# Patient Record
Sex: Female | Born: 1945
Health system: Southern US, Community
[De-identification: ages and names within clinical notes are randomized; demographics above are authoritative.]

## PROBLEM LIST (undated history)

## (undated) DIAGNOSIS — I10 Essential (primary) hypertension: Secondary | ICD-10-CM

## (undated) DIAGNOSIS — R112 Nausea with vomiting, unspecified: Secondary | ICD-10-CM

## (undated) DIAGNOSIS — Z9889 Other specified postprocedural states: Secondary | ICD-10-CM

## (undated) DIAGNOSIS — I499 Cardiac arrhythmia, unspecified: Secondary | ICD-10-CM

## (undated) DIAGNOSIS — C801 Malignant (primary) neoplasm, unspecified: Secondary | ICD-10-CM

## (undated) DIAGNOSIS — F419 Anxiety disorder, unspecified: Secondary | ICD-10-CM

## (undated) DIAGNOSIS — E039 Hypothyroidism, unspecified: Secondary | ICD-10-CM

## (undated) HISTORY — PX: APPENDECTOMY: SHX54

## (undated) HISTORY — PX: BREAST SURGERY: SHX581

## (undated) HISTORY — PX: TUBAL LIGATION: SHX77

## (undated) HISTORY — PX: ABDOMINAL HYSTERECTOMY: SHX81

## (undated) HISTORY — PX: TONSILLECTOMY: SUR1361

---

## 1985-08-15 HISTORY — PX: HIP SURGERY: SHX245

## 1999-07-22 ENCOUNTER — Encounter: Payer: Self-pay | Admitting: Emergency Medicine

## 1999-07-22 ENCOUNTER — Emergency Department (HOSPITAL_COMMUNITY): Admission: EM | Admit: 1999-07-22 | Discharge: 1999-07-22 | Payer: Self-pay | Admitting: Emergency Medicine

## 2003-07-08 ENCOUNTER — Emergency Department (HOSPITAL_COMMUNITY): Admission: AD | Admit: 2003-07-08 | Discharge: 2003-07-08 | Payer: Self-pay | Admitting: Family Medicine

## 2008-03-06 ENCOUNTER — Encounter: Payer: Self-pay | Admitting: Orthopedic Surgery

## 2008-04-07 ENCOUNTER — Ambulatory Visit: Payer: Self-pay | Admitting: Orthopedic Surgery

## 2008-04-07 DIAGNOSIS — M7580 Other shoulder lesions, unspecified shoulder: Secondary | ICD-10-CM

## 2008-04-07 DIAGNOSIS — M25519 Pain in unspecified shoulder: Secondary | ICD-10-CM | POA: Insufficient documentation

## 2008-04-07 DIAGNOSIS — M25819 Other specified joint disorders, unspecified shoulder: Secondary | ICD-10-CM | POA: Insufficient documentation

## 2008-04-09 ENCOUNTER — Telehealth: Payer: Self-pay | Admitting: Orthopedic Surgery

## 2008-05-15 ENCOUNTER — Telehealth: Payer: Self-pay | Admitting: Orthopedic Surgery

## 2008-05-22 ENCOUNTER — Telehealth: Payer: Self-pay | Admitting: Orthopedic Surgery

## 2008-06-03 ENCOUNTER — Ambulatory Visit (HOSPITAL_COMMUNITY): Admission: RE | Admit: 2008-06-03 | Discharge: 2008-06-03 | Payer: Self-pay | Admitting: Orthopedic Surgery

## 2008-06-09 ENCOUNTER — Ambulatory Visit: Payer: Self-pay | Admitting: Orthopedic Surgery

## 2008-06-10 ENCOUNTER — Telehealth: Payer: Self-pay | Admitting: Orthopedic Surgery

## 2008-06-12 ENCOUNTER — Encounter: Payer: Self-pay | Admitting: Orthopedic Surgery

## 2008-07-14 ENCOUNTER — Encounter: Payer: Self-pay | Admitting: Orthopedic Surgery

## 2008-07-16 ENCOUNTER — Ambulatory Visit: Payer: Self-pay | Admitting: Orthopedic Surgery

## 2008-09-01 ENCOUNTER — Ambulatory Visit: Payer: Self-pay | Admitting: Orthopedic Surgery

## 2008-09-01 DIAGNOSIS — M24519 Contracture, unspecified shoulder: Secondary | ICD-10-CM | POA: Insufficient documentation

## 2015-01-05 ENCOUNTER — Other Ambulatory Visit: Payer: Self-pay

## 2015-01-14 ENCOUNTER — Other Ambulatory Visit: Payer: Self-pay | Admitting: Internal Medicine

## 2015-01-14 DIAGNOSIS — R921 Mammographic calcification found on diagnostic imaging of breast: Secondary | ICD-10-CM

## 2015-01-16 ENCOUNTER — Other Ambulatory Visit: Payer: Self-pay | Admitting: Internal Medicine

## 2015-01-16 ENCOUNTER — Ambulatory Visit
Admission: RE | Admit: 2015-01-16 | Discharge: 2015-01-16 | Disposition: A | Discharge status: Home / Self Care | Payer: Medicare HMO | Source: Ambulatory Visit | Attending: Internal Medicine | Admitting: Internal Medicine

## 2015-01-16 DIAGNOSIS — R921 Mammographic calcification found on diagnostic imaging of breast: Secondary | ICD-10-CM

## 2015-03-13 NOTE — Patient Instructions (Signed)
Your procedure is scheduled on: 03/23/2015  Report to [facility] at   1150  AM.  Call this number if you have problems the morning of surgery: [phone]   Do not eat food or drink liquids :After Midnight.      Take these medicines the morning of surgery with A SIP OF WATER: none   Do not wear jewelry, make-up or nail polish.  Do not wear lotions, powders, or perfumes.  Do not shave 48 hours prior to surgery.  Do not bring valuables to the hospital.  Contacts, dentures or bridgework may not be worn into surgery.  Leave suitcase in the car. After surgery it may be brought to your room.  For patients admitted to the hospital, checkout time is 11:00 AM the day of discharge.   Patients discharged the day of surgery will not be allowed to drive home.  :     Please read over the following fact sheets that you were given: Coughing and Deep Breathing, Surgical Site Infection Prevention, Anesthesia Post-op Instructions and Care and Recovery After Surgery    Cataract A cataract is a clouding of the lens of the eye. When a lens becomes cloudy, vision is reduced based on the degree and nature of the clouding. Many cataracts reduce vision to some degree. Some cataracts make people more near-sighted as they develop. Other cataracts increase glare. Cataracts that are ignored and become worse can sometimes look white. The white color can be seen through the pupil. CAUSES   Aging. However, cataracts may occur at any age, even in newborns.   Certain drugs.   Trauma to the eye.   Certain diseases such as diabetes.   Specific eye diseases such as chronic inflammation inside the eye or a sudden attack of a rare form of glaucoma.   Inherited or acquired medical problems.  SYMPTOMS   Gradual, progressive drop in vision in the affected eye.   Severe, rapid visual loss. This most often happens when trauma is the cause.  DIAGNOSIS  To detect a cataract, an eye doctor examines the lens. Cataracts are  best diagnosed with an exam of the eyes with the pupils enlarged (dilated) by drops.  TREATMENT  For an early cataract, vision may improve by using different eyeglasses or stronger lighting. If that does not help your vision, surgery is the only effective treatment. A cataract needs to be surgically removed when vision loss interferes with your everyday activities, such as driving, reading, or watching TV. A cataract may also have to be removed if it prevents examination or treatment of another eye problem. Surgery removes the cloudy lens and usually replaces it with a substitute lens (intraocular lens, IOL).  At a time when both you and your doctor agree, the cataract will be surgically removed. If you have cataracts in both eyes, only one is usually removed at a time. This allows the operated eye to heal and be out of danger from any possible problems after surgery (such as infection or poor wound healing). In rare cases, a cataract may be doing damage to your eye. In these cases, your caregiver may advise surgical removal right away. The vast majority of people who have cataract surgery have better vision afterward. HOME CARE INSTRUCTIONS  If you are not planning surgery, you may be asked to do the following:  Use different eyeglasses.   Use stronger or brighter lighting.   Ask your eye doctor about reducing your medicine dose or changing medicines if  it is thought that a medicine caused your cataract. Changing medicines does not make the cataract go away on its own.   Become familiar with your surroundings. Poor vision can lead to injury. Avoid bumping into things on the affected side. You are at a higher risk for tripping or falling.   Exercise extreme care when driving or operating machinery.   Wear sunglasses if you are sensitive to bright light or experiencing problems with glare.  SEEK IMMEDIATE MEDICAL CARE IF:   You have a worsening or sudden vision loss.   You notice redness,  swelling, or increasing pain in the eye.   You have a fever.  Document Released: 08/01/2005 Document Revised: 07/21/2011 Document Reviewed: 03/25/2011 [facility] Patient Information 2012 [place].PATIENT INSTRUCTIONS POST-ANESTHESIA  IMMEDIATELY FOLLOWING SURGERY:  Do not drive or operate machinery for the first twenty four hours after surgery.  Do not make any important decisions for twenty four hours after surgery or while taking narcotic pain medications or sedatives.  If you develop intractable nausea and vomiting or a severe headache please notify your doctor immediately.  FOLLOW-UP:  Please make an appointment with your surgeon as instructed. You do not need to follow up with anesthesia unless specifically instructed to do so.  WOUND CARE INSTRUCTIONS (if applicable):  Keep a dry clean dressing on the anesthesia/puncture wound site if there is drainage.  Once the wound has quit draining you may leave it open to air.  Generally you should leave the bandage intact for twenty four hours unless there is drainage.  If the epidural site drains for more than 36-48 hours please call the anesthesia department.  QUESTIONS?:  Please feel free to call your physician or the hospital operator if you have any questions, and they will be happy to assist you.

## 2015-03-18 ENCOUNTER — Encounter (HOSPITAL_COMMUNITY)
Admission: RE | Admit: 2015-03-18 | Discharge: 2015-03-18 | Disposition: A | Discharge status: Home / Self Care | Payer: Medicare HMO | Source: Ambulatory Visit | Attending: Ophthalmology | Admitting: Ophthalmology

## 2015-03-18 ENCOUNTER — Other Ambulatory Visit: Payer: Self-pay

## 2015-03-18 ENCOUNTER — Encounter (HOSPITAL_COMMUNITY): Payer: Self-pay

## 2015-03-18 DIAGNOSIS — H2512 Age-related nuclear cataract, left eye: Secondary | ICD-10-CM | POA: Insufficient documentation

## 2015-03-18 DIAGNOSIS — Z01818 Encounter for other preprocedural examination: Secondary | ICD-10-CM | POA: Diagnosis present

## 2015-03-18 HISTORY — DX: Other specified postprocedural states: Z98.890

## 2015-03-18 HISTORY — DX: Essential (primary) hypertension: I10

## 2015-03-18 HISTORY — DX: Cardiac arrhythmia, unspecified: I49.9

## 2015-03-18 HISTORY — DX: Hypothyroidism, unspecified: E03.9

## 2015-03-18 HISTORY — DX: Anxiety disorder, unspecified: F41.9

## 2015-03-18 HISTORY — DX: Other specified postprocedural states: R11.2

## 2015-03-18 LAB — BASIC METABOLIC PANEL
Anion gap: 5 (ref 5–15)
BUN: 14 mg/dL (ref 6–20)
CO2: 30 mmol/L (ref 22–32)
CREATININE: 0.63 mg/dL (ref 0.44–1.00)
Calcium: 8.8 mg/dL — ABNORMAL LOW (ref 8.9–10.3)
Chloride: 105 mmol/L (ref 101–111)
GFR calc Af Amer: >60 mL/min (ref >60)
GFR calc non Af Amer: >60 mL/min (ref >60)
Glucose, Bld: 99 mg/dL (ref 65–99)
Potassium: 3.8 mmol/L (ref 3.5–5.1)
Sodium: 140 mmol/L (ref 135–145)

## 2015-03-18 LAB — CBC WITH DIFFERENTIAL/PLATELET
BASOS ABS: 0 10*3/uL (ref 0.0–0.1)
Basophils Relative: 0 % (ref 0–1)
EOS PCT: 2 % (ref 0–5)
Eosinophils Absolute: 0.1 10*3/uL (ref 0.0–0.7)
HCT: 35.8 % — ABNORMAL LOW (ref 36.0–46.0)
Hemoglobin: 11.3 g/dL — ABNORMAL LOW (ref 12.0–15.0)
LYMPHS ABS: 2.2 10*3/uL (ref 0.7–4.0)
Lymphocytes Relative: 40 % (ref 12–46)
MCH: 26.7 pg (ref 26.0–34.0)
MCHC: 31.6 g/dL (ref 30.0–36.0)
MCV: 84.4 fL (ref 78.0–100.0)
Monocytes Absolute: 0.5 10*3/uL (ref 0.1–1.0)
Monocytes Relative: 8 % (ref 3–12)
NEUTROS PCT: 50 % (ref 43–77)
Neutro Abs: 2.7 10*3/uL (ref 1.7–7.7)
PLATELETS: 257 10*3/uL (ref 150–400)
RBC: 4.24 MIL/uL (ref 3.87–5.11)
RDW: 13.9 % (ref 11.5–15.5)
WBC: 5.5 10*3/uL (ref 4.0–10.5)

## 2015-03-20 MED ORDER — TETRACAINE HCL 0.5 % OP SOLN
OPHTHALMIC | Status: AC
  Filled 2015-03-20: qty 2

## 2015-03-20 MED ORDER — CYCLOPENTOLATE-PHENYLEPHRINE OP SOLN OPTIME - NO CHARGE
OPHTHALMIC | Status: AC
  Filled 2015-03-20: qty 2

## 2015-03-20 MED ORDER — NEOMYCIN-POLYMYXIN-DEXAMETH 3.5-10000-0.1 OP SUSP
OPHTHALMIC | Status: AC
Start: 2015-03-20 — End: 2015-03-20
  Filled 2015-03-20: qty 5

## 2015-03-20 MED ORDER — PHENYLEPHRINE HCL 2.5 % OP SOLN
OPHTHALMIC | Status: AC
  Filled 2015-03-20: qty 15

## 2015-03-20 MED ORDER — LIDOCAINE HCL (PF) 1 % IJ SOLN
INTRAMUSCULAR | Status: AC
  Filled 2015-03-20: qty 2

## 2015-03-20 MED ORDER — LIDOCAINE HCL 3.5 % OP GEL
OPHTHALMIC | Status: AC
  Filled 2015-03-20: qty 1

## 2015-03-23 ENCOUNTER — Ambulatory Visit (HOSPITAL_COMMUNITY): Payer: Medicare HMO | Admitting: Anesthesiology

## 2015-03-23 ENCOUNTER — Encounter (HOSPITAL_COMMUNITY): Payer: Self-pay | Admitting: Ophthalmology

## 2015-03-23 ENCOUNTER — Ambulatory Visit (HOSPITAL_COMMUNITY)
Admission: RE | Admit: 2015-03-23 | Discharge: 2015-03-23 | Disposition: A | Discharge status: Home / Self Care | Payer: Medicare HMO | Source: Ambulatory Visit | Attending: Ophthalmology | Admitting: Ophthalmology

## 2015-03-23 ENCOUNTER — Encounter (HOSPITAL_COMMUNITY)
Admission: RE | Disposition: A | Discharge status: Home / Self Care | Payer: Self-pay | Source: Ambulatory Visit | Attending: Ophthalmology

## 2015-03-23 DIAGNOSIS — F419 Anxiety disorder, unspecified: Secondary | ICD-10-CM | POA: Diagnosis not present

## 2015-03-23 DIAGNOSIS — Z79899 Other long term (current) drug therapy: Secondary | ICD-10-CM | POA: Insufficient documentation

## 2015-03-23 DIAGNOSIS — I10 Essential (primary) hypertension: Secondary | ICD-10-CM | POA: Insufficient documentation

## 2015-03-23 DIAGNOSIS — H2512 Age-related nuclear cataract, left eye: Secondary | ICD-10-CM | POA: Insufficient documentation

## 2015-03-23 DIAGNOSIS — Z7951 Long term (current) use of inhaled steroids: Secondary | ICD-10-CM | POA: Insufficient documentation

## 2015-03-23 DIAGNOSIS — E039 Hypothyroidism, unspecified: Secondary | ICD-10-CM | POA: Diagnosis not present

## 2015-03-23 HISTORY — PX: CATARACT EXTRACTION W/PHACO: SHX586

## 2015-03-23 SURGERY — PHACOEMULSIFICATION, CATARACT, WITH IOL INSERTION
Anesthesia: Monitor Anesthesia Care | Site: Eye | Laterality: Left

## 2015-03-23 MED ORDER — CYCLOPENTOLATE-PHENYLEPHRINE 0.2-1 % OP SOLN
1.0000 [drp] | OPHTHALMIC | Status: AC
  Administered 2015-03-23 (×3): 1 [drp] via OPHTHALMIC

## 2015-03-23 MED ORDER — NEOMYCIN-POLYMYXIN-DEXAMETH 3.5-10000-0.1 OP SUSP
OPHTHALMIC | Status: AC
  Filled 2015-03-23: qty 5

## 2015-03-23 MED ORDER — FENTANYL CITRATE (PF) 100 MCG/2ML IJ SOLN
INTRAMUSCULAR | Status: AC
  Filled 2015-03-23: qty 2

## 2015-03-23 MED ORDER — TETRACAINE HCL 0.5 % OP SOLN
1.0000 [drp] | OPHTHALMIC | Status: AC
  Administered 2015-03-23 (×3): 1 [drp] via OPHTHALMIC

## 2015-03-23 MED ORDER — FENTANYL CITRATE (PF) 100 MCG/2ML IJ SOLN
25.0000 ug | INTRAMUSCULAR | Status: AC
  Administered 2015-03-23 (×2): 25 ug via INTRAVENOUS

## 2015-03-23 MED ORDER — EPINEPHRINE HCL 1 MG/ML IJ SOLN
INTRAOCULAR | Status: DC | PRN
  Administered 2015-03-23: 500 mL

## 2015-03-23 MED ORDER — PHENYLEPHRINE HCL 2.5 % OP SOLN
1.0000 [drp] | OPHTHALMIC | Status: AC
  Administered 2015-03-23 (×3): 1 [drp] via OPHTHALMIC

## 2015-03-23 MED ORDER — ONDANSETRON HCL 4 MG/2ML IJ SOLN
INTRAMUSCULAR | Status: AC
  Filled 2015-03-23: qty 2

## 2015-03-23 MED ORDER — NEOMYCIN-POLYMYXIN-DEXAMETH 3.5-10000-0.1 OP SUSP
OPHTHALMIC | Status: DC | PRN
  Administered 2015-03-23: 2 [drp] via OPHTHALMIC

## 2015-03-23 MED ORDER — LIDOCAINE HCL (PF) 1 % IJ SOLN
INTRAMUSCULAR | Status: DC | PRN
  Administered 2015-03-23: .5 mL

## 2015-03-23 MED ORDER — BSS IO SOLN
INTRAOCULAR | Status: DC | PRN
  Administered 2015-03-23: 15 mL

## 2015-03-23 MED ORDER — POVIDONE-IODINE 5 % OP SOLN
OPHTHALMIC | Status: DC | PRN
  Administered 2015-03-23: 1 via OPHTHALMIC

## 2015-03-23 MED ORDER — LIDOCAINE HCL 3.5 % OP GEL: 1.0000 "application " | Freq: Once | OPHTHALMIC | Status: DC

## 2015-03-23 MED ORDER — PROVISC 10 MG/ML IO SOLN
INTRAOCULAR | Status: DC | PRN
  Administered 2015-03-23: 0.85 mL via INTRAOCULAR

## 2015-03-23 MED ORDER — EPINEPHRINE HCL 1 MG/ML IJ SOLN
INTRAMUSCULAR | Status: AC
  Filled 2015-03-23: qty 1

## 2015-03-23 MED ORDER — LACTATED RINGERS IV SOLN
INTRAVENOUS | Status: DC
  Administered 2015-03-23: 13:00:00 via INTRAVENOUS

## 2015-03-23 MED ORDER — MIDAZOLAM HCL 2 MG/2ML IJ SOLN
INTRAMUSCULAR | Status: AC
  Filled 2015-03-23: qty 2

## 2015-03-23 MED ORDER — ONDANSETRON HCL 4 MG/2ML IJ SOLN
4.0000 mg | Freq: Once | INTRAMUSCULAR | Status: AC
  Administered 2015-03-23: 4 mg via INTRAVENOUS

## 2015-03-23 MED ORDER — MIDAZOLAM HCL 2 MG/2ML IJ SOLN
1.0000 mg | INTRAMUSCULAR | Status: DC | PRN
  Administered 2015-03-23: 2 mg via INTRAVENOUS

## 2015-03-23 SURGICAL SUPPLY — 33 items
CAPSULAR TENSION RING-AMO (OPHTHALMIC RELATED) IMPLANT
CLOTH BEACON ORANGE TIMEOUT ST (SAFETY) ×2 IMPLANT
EYE SHIELD UNIVERSAL CLEAR (GAUZE/BANDAGES/DRESSINGS) ×2 IMPLANT
GLOVE BIO SURGEON STRL SZ 6.5 (GLOVE) IMPLANT
GLOVE BIOGEL PI IND STRL 6.5 (GLOVE) IMPLANT
GLOVE BIOGEL PI IND STRL 7.0 (GLOVE) ×1 IMPLANT
GLOVE BIOGEL PI IND STRL 7.5 (GLOVE) IMPLANT
GLOVE BIOGEL PI INDICATOR 6.5 (GLOVE)
GLOVE BIOGEL PI INDICATOR 7.0 (GLOVE) ×1
GLOVE BIOGEL PI INDICATOR 7.5 (GLOVE)
GLOVE ECLIPSE 6.5 STRL STRAW (GLOVE) IMPLANT
GLOVE ECLIPSE 7.0 STRL STRAW (GLOVE) IMPLANT
GLOVE ECLIPSE 7.5 STRL STRAW (GLOVE) IMPLANT
GLOVE EXAM NITRILE LRG STRL (GLOVE) IMPLANT
GLOVE EXAM NITRILE MD LF STRL (GLOVE) ×2 IMPLANT
GLOVE SKINSENSE NS SZ6.5 (GLOVE)
GLOVE SKINSENSE NS SZ7.0 (GLOVE)
GLOVE SKINSENSE STRL SZ6.5 (GLOVE) IMPLANT
GLOVE SKINSENSE STRL SZ7.0 (GLOVE) IMPLANT
KIT VITRECTOMY (OPHTHALMIC RELATED) IMPLANT
PAD ARMBOARD 7.5X6 YLW CONV (MISCELLANEOUS) ×2 IMPLANT
PROC W NO LENS (INTRAOCULAR LENS)
PROC W SPEC LENS (INTRAOCULAR LENS)
PROCESS W NO LENS (INTRAOCULAR LENS) IMPLANT
PROCESS W SPEC LENS (INTRAOCULAR LENS) IMPLANT
RETRACTOR IRIS SIGHTPATH (OPHTHALMIC RELATED) IMPLANT
RING MALYGIN (MISCELLANEOUS) IMPLANT
SIGHTPATH CAT PROC W REG LENS (Ophthalmic Related) ×2 IMPLANT
SYRINGE LUER LOK 1CC (MISCELLANEOUS) ×2 IMPLANT
TAPE SURG TRANSPORE 1 IN (GAUZE/BANDAGES/DRESSINGS) ×1 IMPLANT
TAPE SURGICAL TRANSPORE 1 IN (GAUZE/BANDAGES/DRESSINGS) ×1
VISCOELASTIC ADDITIONAL (OPHTHALMIC RELATED) IMPLANT
WATER STERILE IRR 250ML POUR (IV SOLUTION) ×2 IMPLANT

## 2015-03-23 NOTE — H&P (Signed)
I have reviewed the H&P, the patient was re-examined, and I have identified no interval changes in medical condition and plan of care since the history and physical of record  

## 2015-03-23 NOTE — Anesthesia Postprocedure Evaluation (Signed)
  Anesthesia Post-op Note  Patient: Cynthia Gonzales  Procedure(s) Performed: Procedure(s) with comments: CATARACT EXTRACTION PHACO AND INTRAOCULAR LENS PLACEMENT (IOC) (Left) - CDE 10.11  Patient Location: Short Stay  Anesthesia Type:MAC  Level of Consciousness: awake, alert , oriented and patient cooperative  Airway and Oxygen Therapy: Patient Spontanous Breathing  Post-op Pain: none  Post-op Assessment: Post-op Vital signs reviewed, Patient's Cardiovascular Status Stable, Respiratory Function Stable, RESPIRATORY FUNCTION UNSTABLE, No signs of Nausea or vomiting and Pain level controlled              Post-op Vital Signs: Reviewed and stable  Last Vitals:  Filed Vitals:   03/23/15 1340  BP: 123/66  Temp:   Resp: 39    Complications: No apparent anesthesia complications

## 2015-03-23 NOTE — Anesthesia Procedure Notes (Signed)
Procedure Name: MAC Date/Time: 03/23/2015 1:45 PM Performed by: Vista Deck Pre-anesthesia Checklist: Patient identified, Emergency Drugs available, Suction available, Timeout performed and Patient being monitored Patient Re-evaluated:Patient Re-evaluated prior to inductionOxygen Delivery Method: Nasal Cannula

## 2015-03-23 NOTE — Anesthesia Preprocedure Evaluation (Signed)
Anesthesia Evaluation  Patient identified by MRN, date of birth, ID band Patient awake    Reviewed: Allergy & Precautions, NPO status , Patient's Chart, lab work & pertinent test results, reviewed documented beta blocker date and time   History of Anesthesia Complications (+) PONV and history of anesthetic complications  Airway Mallampati: II  TM Distance: >3 FB     Dental  (+) Teeth Intact   Pulmonary neg pulmonary ROS,  breath sounds clear to auscultation        Cardiovascular hypertension, Pt. on medications and Pt. on home beta blockers + dysrhythmias Rhythm:Regular     Neuro/Psych PSYCHIATRIC DISORDERS Anxiety    GI/Hepatic negative GI ROS,   Endo/Other  Hypothyroidism   Renal/GU      Musculoskeletal   Abdominal   Peds  Hematology   Anesthesia Other Findings   Reproductive/Obstetrics                             Anesthesia Physical Anesthesia Plan  ASA: II  Anesthesia Plan: MAC   Post-op Pain Management:    Induction: Intravenous  Airway Management Planned: Nasal Cannula  Additional Equipment:   Intra-op Plan:   Post-operative Plan:   Informed Consent: I have reviewed the patients History and Physical, chart, labs and discussed the procedure including the risks, benefits and alternatives for the proposed anesthesia with the patient or authorized representative who has indicated his/her understanding and acceptance.     Plan Discussed with:   Anesthesia Plan Comments:         Anesthesia Quick Evaluation

## 2015-03-23 NOTE — Discharge Instructions (Signed)

## 2015-03-23 NOTE — Transfer of Care (Signed)
Immediate Anesthesia Transfer of Care Note  Patient: Cynthia Gonzales  Procedure(s) Performed: Procedure(s) with comments: CATARACT EXTRACTION PHACO AND INTRAOCULAR LENS PLACEMENT (IOC) (Left) - CDE 10.11  Patient Location: Short Stay  Anesthesia Type:MAC  Level of Consciousness: awake, alert , oriented and patient cooperative  Airway & Oxygen Therapy: Patient Spontanous Breathing  Post-op Assessment: Report given to RN, Post -op Vital signs reviewed and stable and Patient moving all extremities  Post vital signs: Reviewed and stable  Last Vitals:  Filed Vitals:   03/23/15 1340  BP: 123/66  Temp:   Resp: 39    Complications: No apparent anesthesia complications

## 2015-03-23 NOTE — Op Note (Signed)
Date of Admission: 03/23/2015  Date of Surgery: 03/23/2015   Pre-Op Dx: Cataract Left Eye  Post-Op Dx: Senile Nuclear Cataract Left  Eye,  Dx Code H25.12  Surgeon: Tonny Branch, M.D.  Assistants: None  Anesthesia: Topical with MAC  Indications: Painless, progressive loss of vision with compromise of daily activities.  Surgery: Cataract Extraction with Intraocular lens Implant Left Eye  Discription: The patient had dilating drops and viscous lidocaine placed into the Left eye in the pre-op holding area. After transfer to the operating room, a time out was performed. The patient was then prepped and draped. Beginning with a 68 degree blade a paracentesis port was made at the surgeon's 2 o'clock position. The anterior chamber was then filled with 1% non-preserved lidocaine. This was followed by filling the anterior chamber with Provisc.  A 2.36mm keratome blade was used to make a clear corneal incision at the temporal limbus.  A bent cystatome needle was used to create a continuous tear capsulotomy. Hydrodissection was performed with balanced salt solution on a Fine canula. The lens nucleus was then removed using the phacoemulsification handpiece. Residual cortex was removed with the I&A handpiece. The anterior chamber and capsular bag were refilled with Provisc. A posterior chamber intraocular lens was placed into the capsular bag with it's injector. The implant was positioned with the Kuglan hook. The Provisc was then removed from the anterior chamber and capsular bag with the I&A handpiece. Stromal hydration of the main incision and paracentesis port was performed with BSS on a Fine canula. The wounds were tested for leak which was negative. The patient tolerated the procedure well. There were no operative complications. The patient was then transferred to the recovery room in stable condition.  Complications: None  Specimen: None  EBL: None  Prosthetic device: Hoya iSert 250, power 23.5 D, SN  [id number].

## 2015-03-24 ENCOUNTER — Encounter (HOSPITAL_COMMUNITY): Payer: Self-pay | Admitting: Ophthalmology

## 2015-04-08 ENCOUNTER — Other Ambulatory Visit: Payer: Self-pay

## 2015-05-18 ENCOUNTER — Other Ambulatory Visit (HOSPITAL_COMMUNITY): Payer: Medicare HMO

## 2017-11-23 ENCOUNTER — Other Ambulatory Visit: Payer: Self-pay | Admitting: Internal Medicine

## 2017-11-23 DIAGNOSIS — Z1231 Encounter for screening mammogram for malignant neoplasm of breast: Secondary | ICD-10-CM

## 2018-02-20 ENCOUNTER — Ambulatory Visit
Admission: RE | Admit: 2018-02-20 | Discharge: 2018-02-20 | Disposition: A | Discharge status: Home / Self Care | Payer: Medicare HMO | Source: Ambulatory Visit | Attending: Internal Medicine | Admitting: Internal Medicine

## 2018-02-20 DIAGNOSIS — Z1231 Encounter for screening mammogram for malignant neoplasm of breast: Secondary | ICD-10-CM

## 2018-06-15 NOTE — Patient Instructions (Signed)
Your procedure is scheduled on: 06/25/2018   Report to [facility] at  900  AM.  Call this number if you have problems the morning of surgery: [phone]   Do not eat food or drink liquids :After Midnight.      Take these medicines the morning of surgery with A SIP OF WATER: amlodipine, levothyroxine, metoprolol.   Do not wear jewelry, make-up or nail polish.  Do not wear lotions, powders, or perfumes. You may wear deodorant.  Do not shave 48 hours prior to surgery.  Do not bring valuables to the hospital.  Contacts, dentures or bridgework may not be worn into surgery.  Leave suitcase in the car. After surgery it may be brought to your room.  For patients admitted to the hospital, checkout time is 11:00 AM the day of discharge.   Patients discharged the day of surgery will not be allowed to drive home.  :     Please read over the following fact sheets that you were given: Coughing and Deep Breathing, Surgical Site Infection Prevention, Anesthesia Post-op Instructions and Care and Recovery After Surgery    Cataract A cataract is a clouding of the lens of the eye. When a lens becomes cloudy, vision is reduced based on the degree and nature of the clouding. Many cataracts reduce vision to some degree. Some cataracts make people more near-sighted as they develop. Other cataracts increase glare. Cataracts that are ignored and become worse can sometimes look white. The white color can be seen through the pupil. CAUSES   Aging. However, cataracts may occur at any age, even in newborns.   Certain drugs.   Trauma to the eye.   Certain diseases such as diabetes.   Specific eye diseases such as chronic inflammation inside the eye or a sudden attack of a rare form of glaucoma.   Inherited or acquired medical problems.  SYMPTOMS   Gradual, progressive drop in vision in the affected eye.   Severe, rapid visual loss. This most often happens when trauma is the cause.  DIAGNOSIS  To detect a  cataract, an eye doctor examines the lens. Cataracts are best diagnosed with an exam of the eyes with the pupils enlarged (dilated) by drops.  TREATMENT  For an early cataract, vision may improve by using different eyeglasses or stronger lighting. If that does not help your vision, surgery is the only effective treatment. A cataract needs to be surgically removed when vision loss interferes with your everyday activities, such as driving, reading, or watching TV. A cataract may also have to be removed if it prevents examination or treatment of another eye problem. Surgery removes the cloudy lens and usually replaces it with a substitute lens (intraocular lens, IOL).  At a time when both you and your doctor agree, the cataract will be surgically removed. If you have cataracts in both eyes, only one is usually removed at a time. This allows the operated eye to heal and be out of danger from any possible problems after surgery (such as infection or poor wound healing). In rare cases, a cataract may be doing damage to your eye. In these cases, your caregiver may advise surgical removal right away. The vast majority of people who have cataract surgery have better vision afterward. HOME CARE INSTRUCTIONS  If you are not planning surgery, you may be asked to do the following:  Use different eyeglasses.   Use stronger or brighter lighting.   Ask your eye doctor about reducing your  medicine dose or changing medicines if it is thought that a medicine caused your cataract. Changing medicines does not make the cataract go away on its own.   Become familiar with your surroundings. Poor vision can lead to injury. Avoid bumping into things on the affected side. You are at a higher risk for tripping or falling.   Exercise extreme care when driving or operating machinery.   Wear sunglasses if you are sensitive to bright light or experiencing problems with glare.  SEEK IMMEDIATE MEDICAL CARE IF:   You have a  worsening or sudden vision loss.   You notice redness, swelling, or increasing pain in the eye.   You have a fever.  Document Released: 08/01/2005 Document Revised: 07/21/2011 Document Reviewed: 03/25/2011 [facility] Patient Information 2012 [place].PATIENT INSTRUCTIONS POST-ANESTHESIA  IMMEDIATELY FOLLOWING SURGERY:  Do not drive or operate machinery for the first twenty four hours after surgery.  Do not make any important decisions for twenty four hours after surgery or while taking narcotic pain medications or sedatives.  If you develop intractable nausea and vomiting or a severe headache please notify your doctor immediately.  FOLLOW-UP:  Please make an appointment with your surgeon as instructed. You do not need to follow up with anesthesia unless specifically instructed to do so.  WOUND CARE INSTRUCTIONS (if applicable):  Keep a dry clean dressing on the anesthesia/puncture wound site if there is drainage.  Once the wound has quit draining you may leave it open to air.  Generally you should leave the bandage intact for twenty four hours unless there is drainage.  If the epidural site drains for more than 36-48 hours please call the anesthesia department.  QUESTIONS?:  Please feel free to call your physician or the hospital operator if you have any questions, and they will be happy to assist you.

## 2018-06-21 ENCOUNTER — Other Ambulatory Visit: Payer: Self-pay

## 2018-06-21 ENCOUNTER — Encounter (HOSPITAL_COMMUNITY): Payer: Self-pay

## 2018-06-21 ENCOUNTER — Encounter (HOSPITAL_COMMUNITY)
Admission: RE | Admit: 2018-06-21 | Discharge: 2018-06-21 | Disposition: A | Discharge status: Home / Self Care | Payer: Medicare HMO | Source: Ambulatory Visit | Attending: Ophthalmology | Admitting: Ophthalmology

## 2018-06-21 DIAGNOSIS — Z01818 Encounter for other preprocedural examination: Secondary | ICD-10-CM | POA: Diagnosis present

## 2018-06-21 HISTORY — DX: Malignant (primary) neoplasm, unspecified: C80.1

## 2018-06-21 LAB — CBC WITH DIFFERENTIAL/PLATELET
ABS IMMATURE GRANULOCYTES: 0.01 10*3/uL (ref 0.00–0.07)
Basophils Absolute: 0 10*3/uL (ref 0.0–0.1)
Basophils Relative: 0 %
Eosinophils Absolute: 0.1 10*3/uL (ref 0.0–0.5)
Eosinophils Relative: 1 %
HEMATOCRIT: 38.8 % (ref 36.0–46.0)
HEMOGLOBIN: 11.6 g/dL — AB (ref 12.0–15.0)
Immature Granulocytes: 0 %
LYMPHS ABS: 1.7 10*3/uL (ref 0.7–4.0)
LYMPHS PCT: 29 %
MCH: 25.8 pg — AB (ref 26.0–34.0)
MCHC: 29.9 g/dL — ABNORMAL LOW (ref 30.0–36.0)
MCV: 86.2 fL (ref 80.0–100.0)
MONOS PCT: 8 %
Monocytes Absolute: 0.5 10*3/uL (ref 0.1–1.0)
NEUTROS ABS: 3.5 10*3/uL (ref 1.7–7.7)
NRBC: 0 % (ref 0.0–0.2)
Neutrophils Relative %: 62 %
Platelets: 257 10*3/uL (ref 150–400)
RBC: 4.5 MIL/uL (ref 3.87–5.11)
RDW: 13.8 % (ref 11.5–15.5)
WBC: 5.7 10*3/uL (ref 4.0–10.5)

## 2018-06-21 LAB — BASIC METABOLIC PANEL
Anion gap: 9 (ref 5–15)
BUN: 15 mg/dL (ref 8–23)
CHLORIDE: 104 mmol/L (ref 98–111)
CO2: 27 mmol/L (ref 22–32)
Calcium: 9.1 mg/dL (ref 8.9–10.3)
Creatinine, Ser: 0.73 mg/dL (ref 0.44–1.00)
GFR calc Af Amer: >60 mL/min (ref >60)
GFR calc non Af Amer: >60 mL/min (ref >60)
GLUCOSE: 111 mg/dL — AB (ref 70–99)
Potassium: 3.6 mmol/L (ref 3.5–5.1)
Sodium: 140 mmol/L (ref 135–145)

## 2018-06-25 ENCOUNTER — Encounter (HOSPITAL_COMMUNITY)
Admission: RE | Disposition: A | Discharge status: Home / Self Care | Payer: Self-pay | Source: Ambulatory Visit | Attending: Ophthalmology

## 2018-06-25 ENCOUNTER — Other Ambulatory Visit: Payer: Self-pay

## 2018-06-25 ENCOUNTER — Encounter (HOSPITAL_COMMUNITY): Payer: Self-pay | Admitting: *Deleted

## 2018-06-25 ENCOUNTER — Ambulatory Visit (HOSPITAL_COMMUNITY): Payer: Medicare HMO | Admitting: Anesthesiology

## 2018-06-25 ENCOUNTER — Ambulatory Visit (HOSPITAL_COMMUNITY)
Admission: RE | Admit: 2018-06-25 | Discharge: 2018-06-25 | Disposition: A | Discharge status: Home / Self Care | Payer: Medicare HMO | Source: Ambulatory Visit | Attending: Ophthalmology | Admitting: Ophthalmology

## 2018-06-25 DIAGNOSIS — I10 Essential (primary) hypertension: Secondary | ICD-10-CM | POA: Diagnosis not present

## 2018-06-25 DIAGNOSIS — H2511 Age-related nuclear cataract, right eye: Secondary | ICD-10-CM | POA: Insufficient documentation

## 2018-06-25 DIAGNOSIS — Z79899 Other long term (current) drug therapy: Secondary | ICD-10-CM | POA: Insufficient documentation

## 2018-06-25 DIAGNOSIS — F419 Anxiety disorder, unspecified: Secondary | ICD-10-CM | POA: Diagnosis not present

## 2018-06-25 DIAGNOSIS — E039 Hypothyroidism, unspecified: Secondary | ICD-10-CM | POA: Diagnosis not present

## 2018-06-25 HISTORY — PX: CATARACT EXTRACTION W/PHACO: SHX586

## 2018-06-25 SURGERY — PHACOEMULSIFICATION, CATARACT, WITH IOL INSERTION
Anesthesia: Monitor Anesthesia Care | Site: Eye | Laterality: Right

## 2018-06-25 MED ORDER — CYCLOPENTOLATE-PHENYLEPHRINE 0.2-1 % OP SOLN
1.0000 [drp] | OPHTHALMIC | Status: AC | PRN
  Administered 2018-06-25 (×3): 1 [drp] via OPHTHALMIC

## 2018-06-25 MED ORDER — EPINEPHRINE PF 1 MG/ML IJ SOLN
INTRAOCULAR | Status: DC | PRN
  Administered 2018-06-25: 500 mL

## 2018-06-25 MED ORDER — POVIDONE-IODINE 5 % OP SOLN
OPHTHALMIC | Status: DC | PRN
  Administered 2018-06-25: 1 via OPHTHALMIC

## 2018-06-25 MED ORDER — MIDAZOLAM HCL 2 MG/2ML IJ SOLN
INTRAMUSCULAR | Status: AC
  Filled 2018-06-25: qty 2

## 2018-06-25 MED ORDER — LIDOCAINE HCL 3.5 % OP GEL
1.0000 "application " | Freq: Once | OPHTHALMIC | Status: AC
  Administered 2018-06-25: 1 via OPHTHALMIC

## 2018-06-25 MED ORDER — PROVISC 10 MG/ML IO SOLN
INTRAOCULAR | Status: DC | PRN
  Administered 2018-06-25: 0.85 mL via INTRAOCULAR

## 2018-06-25 MED ORDER — LIDOCAINE HCL (PF) 1 % IJ SOLN
INTRAMUSCULAR | Status: DC | PRN
  Administered 2018-06-25: .4 mL

## 2018-06-25 MED ORDER — BSS IO SOLN
INTRAOCULAR | Status: DC | PRN
  Administered 2018-06-25: 15 mL via INTRAOCULAR

## 2018-06-25 MED ORDER — LACTATED RINGERS IV SOLN
INTRAVENOUS | Status: DC
  Administered 2018-06-25: 10:00:00 via INTRAVENOUS

## 2018-06-25 MED ORDER — NEOMYCIN-POLYMYXIN-DEXAMETH 3.5-10000-0.1 OP SUSP
OPHTHALMIC | Status: DC | PRN
  Administered 2018-06-25: 2 [drp] via OPHTHALMIC

## 2018-06-25 MED ORDER — PHENYLEPHRINE HCL 2.5 % OP SOLN
1.0000 [drp] | OPHTHALMIC | Status: AC | PRN
  Administered 2018-06-25 (×3): 1 [drp] via OPHTHALMIC

## 2018-06-25 MED ORDER — TETRACAINE HCL 0.5 % OP SOLN
1.0000 [drp] | OPHTHALMIC | Status: AC | PRN
  Administered 2018-06-25 (×3): 1 [drp] via OPHTHALMIC

## 2018-06-25 MED ORDER — MIDAZOLAM HCL 5 MG/5ML IJ SOLN
INTRAMUSCULAR | Status: DC | PRN
  Administered 2018-06-25 (×2): 1 mg via INTRAVENOUS

## 2018-06-25 SURGICAL SUPPLY — 12 items
CLOTH BEACON ORANGE TIMEOUT ST (SAFETY) ×2 IMPLANT
EYE SHIELD UNIVERSAL CLEAR (GAUZE/BANDAGES/DRESSINGS) ×2 IMPLANT
GLOVE BIOGEL PI IND STRL 7.0 (GLOVE) ×2 IMPLANT
GLOVE BIOGEL PI INDICATOR 7.0 (GLOVE) ×2
LENS ALC ACRYL/TECN (Ophthalmic Related) ×2 IMPLANT
NEEDLE HYPO 18GX1.5 BLUNT FILL (NEEDLE) ×2 IMPLANT
PAD ARMBOARD 7.5X6 YLW CONV (MISCELLANEOUS) ×2 IMPLANT
PROC W SPEC LENS (INTRAOCULAR LENS)
PROCESS W SPEC LENS (INTRAOCULAR LENS) IMPLANT
SYRINGE LUER LOK 1CC (MISCELLANEOUS) ×2 IMPLANT
TAPE PAPER MEDFIX 1IN X 10YD (GAUZE/BANDAGES/DRESSINGS) ×2 IMPLANT
WATER STERILE IRR 250ML POUR (IV SOLUTION) ×2 IMPLANT

## 2018-06-25 NOTE — Anesthesia Procedure Notes (Signed)
Procedure Name: MAC Date/Time: 06/25/2018 10:26 AM Performed by: Andree Elk Elvin Mccartin A, CRNA Pre-anesthesia Checklist: Patient identified, Emergency Drugs available, Suction available, Timeout performed and Patient being monitored Patient Re-evaluated:Patient Re-evaluated prior to induction Oxygen Delivery Method: Nasal Cannula

## 2018-06-25 NOTE — Anesthesia Preprocedure Evaluation (Signed)
Anesthesia Evaluation    History of Anesthesia Complications (+) PONV and history of anesthetic complications  Airway Mallampati: II       Dental  (+) Teeth Intact, Dental Advidsory Given   Pulmonary    breath sounds clear to auscultation       Cardiovascular hypertension, On Medications + dysrhythmias  Rhythm:regular     Neuro/Psych Anxiety    GI/Hepatic   Endo/Other  Hypothyroidism   Renal/GU      Musculoskeletal   Abdominal   Peds  Hematology   Anesthesia Other Findings NSR 76  Reproductive/Obstetrics                             Anesthesia Physical Anesthesia Plan  ASA: III  Anesthesia Plan: MAC   Post-op Pain Management:    Induction:   PONV Risk Score and Plan:   Airway Management Planned:   Additional Equipment:   Intra-op Plan:   Post-operative Plan:   Informed Consent:   Plan Discussed with: Anesthesiologist  Anesthesia Plan Comments:         Anesthesia Quick Evaluation

## 2018-06-25 NOTE — Anesthesia Postprocedure Evaluation (Signed)
Anesthesia Post Note  Patient: Cynthia Gonzales  Procedure(s) Performed: CATARACT EXTRACTION PHACO AND INTRAOCULAR LENS PLACEMENT RIGHT EYE (Right Eye)  Patient location during evaluation: Short Stay Anesthesia Type: MAC Level of consciousness: awake and alert and oriented Pain management: pain level controlled Vital Signs Assessment: post-procedure vital signs reviewed and stable Respiratory status: spontaneous breathing Cardiovascular status: stable Postop Assessment: no apparent nausea or vomiting Anesthetic complications: no     Last Vitals:  Vitals:   06/25/18 0925  BP: (!) 147/75  Pulse: 77  Resp: 18  Temp: 36.8 C  SpO2: 96%    Last Pain:  Vitals:   06/25/18 0925  TempSrc: Oral  PainSc: 0-No pain                 ,  A

## 2018-06-25 NOTE — Op Note (Signed)
Date of Admission: 06/25/2018  Date of Surgery: 06/25/2018  Pre-Op Dx: Cataract Right  Eye  Post-Op Dx: Senile Nuclear Cataract  Right  Eye,  Dx Code H25.11  Surgeon: Tonny Branch, M.D.  Assistants: None  Anesthesia: Topical with MAC  Indications: Painless, progressive loss of vision with compromise of daily activities.  Surgery: Cataract Extraction with Intraocular lens Implant Right Eye  Discription: The patient had dilating drops and viscous lidocaine placed into the Right eye in the pre-op holding area. After transfer to the operating room, a time out was performed. The patient was then prepped and draped. Beginning with a 66m blade a paracentesis port was made at the surgeon's 2 o'clock position. The anterior chamber was then filled with 1% non-preserved lidocaine. This was followed by filling the anterior chamber with Provisc.  A 2.432mkeratome blade was used to make a clear corneal incision at the temporal limbus.  A bent cystatome needle was used to create a continuous tear capsulotomy. Hydrodissection was performed with balanced salt solution on a Fine canula. The lens nucleus was then removed using the phacoemulsification handpiece. Residual cortex was removed with the I&A handpiece. The anterior chamber and capsular bag were refilled with Provisc. A posterior chamber intraocular lens was placed into the capsular bag with it's injector. The implant was positioned with the Kuglan hook. The Provisc was then removed from the anterior chamber and capsular bag with the I&A handpiece. Stromal hydration of the main incision and paracentesis port was performed with BSS on a Fine canula. The wounds were tested for leak which was negative. The patient tolerated the procedure well. There were no operative complications. The patient was then transferred to the recovery room in stable condition.  Complications: None  Specimen: None  EBL: None  Prosthetic device: [employer] Technis, PCB00, power 22.0,  SN [id number]

## 2018-06-25 NOTE — Discharge Instructions (Signed)
Monitored Anesthesia Care, Care After  These instructions provide you with information about caring for yourself after your procedure. Your health care provider may also give you more specific instructions. Your treatment has been planned according to current medical practices, but problems sometimes occur. Call your health care provider if you have any problems or questions after your procedure.  What can I expect after the procedure?  After your procedure, it is common to:   Feel sleepy for several hours.   Feel clumsy and have poor balance for several hours.   Feel forgetful about what happened after the procedure.   Have poor judgment for several hours.   Feel nauseous or vomit.   Have a sore throat if you had a breathing tube during the procedure.    Follow these instructions at home:  For at least 24 hours after the procedure:     Do not:  ? Participate in activities in which you could fall or become injured.  ? Drive.  ? Use heavy machinery.  ? Drink alcohol.  ? Take sleeping pills or medicines that cause drowsiness.  ? Make important decisions or sign legal documents.  ? Take care of children on your own.   Rest.  Eating and drinking   Follow the diet that is recommended by your health care provider.   If you vomit, drink water, juice, or soup when you can drink without vomiting.   Make sure you have little or no nausea before eating solid foods.  General instructions   Have a responsible adult stay with you until you are awake and alert.   Take over-the-counter and prescription medicines only as told by your health care provider.   If you smoke, do not smoke without supervision.   Keep all follow-up visits as told by your health care provider. This is important.  Contact a health care provider if:   You keep feeling nauseous or you keep vomiting.   You feel light-headed.   You develop a rash.   You have a fever.  Get help right away if:   You have trouble breathing.  This information is  not intended to replace advice given to you by your health care provider. Make sure you discuss any questions you have with your health care provider.  Document Released: 11/22/2015 Document Revised: 03/23/2016 Document Reviewed: 11/22/2015  [employer]vier Interactive Patient Education  2018 [employer]

## 2018-06-25 NOTE — Transfer of Care (Signed)
Immediate Anesthesia Transfer of Care Note  Patient: Cynthia Gonzales  Procedure(s) Performed: CATARACT EXTRACTION PHACO AND INTRAOCULAR LENS PLACEMENT RIGHT EYE (Right Eye)  Patient Location: Short Stay  Anesthesia Type:MAC  Level of Consciousness: awake, alert , oriented and patient cooperative  Airway & Oxygen Therapy: Patient Spontanous Breathing  Post-op Assessment: Report given to RN and Post -op Vital signs reviewed and stable  Post vital signs: Reviewed and stable  Last Vitals:  Vitals Value Taken Time  BP    Temp    Pulse    Resp    SpO2      Last Pain:  Vitals:   06/25/18 0925  TempSrc: Oral  PainSc: 0-No pain      Patients Stated Pain Goal: 6 ([id number])  Complications: No apparent anesthesia complications

## 2018-06-25 NOTE — H&P (Signed)
I have reviewed the H&P, the patient was re-examined, and I have identified no interval changes in medical condition and plan of care since the history and physical of record  

## 2018-06-26 ENCOUNTER — Encounter (HOSPITAL_COMMUNITY): Payer: Self-pay | Admitting: Ophthalmology

## 2020-06-01 IMAGING — MG DIGITAL SCREENING BILATERAL MAMMOGRAM WITH TOMO AND CAD
8 series · 9 of 24 positions shown · non-contrast
Comparison: Previous exam(s).

CLINICAL DATA: Screening.

EXAM:
DIGITAL SCREENING BILATERAL MAMMOGRAM WITH TOMO AND CAD

[L CC synth-2D]
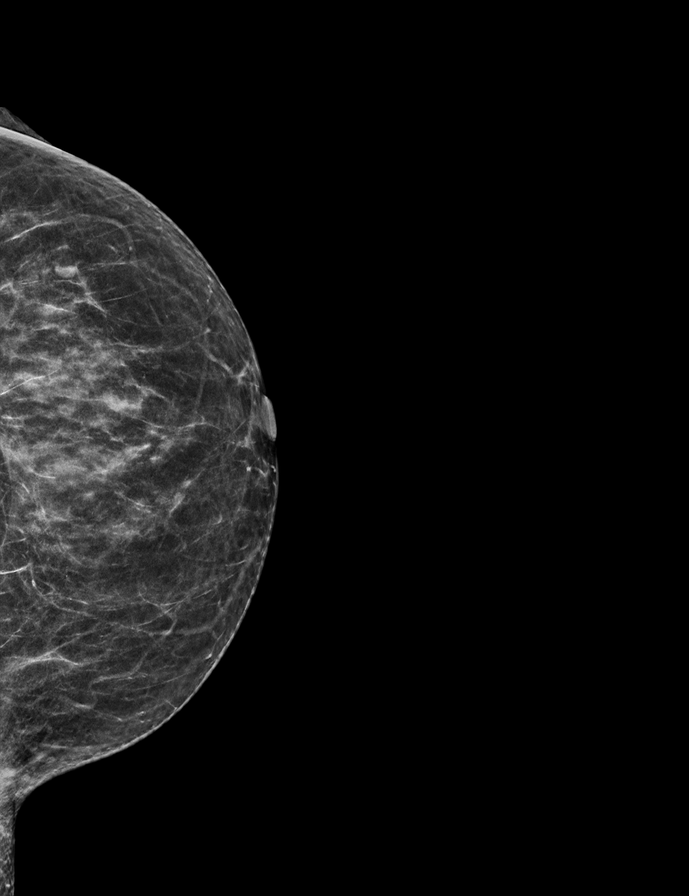

[L MLO synth-2D]
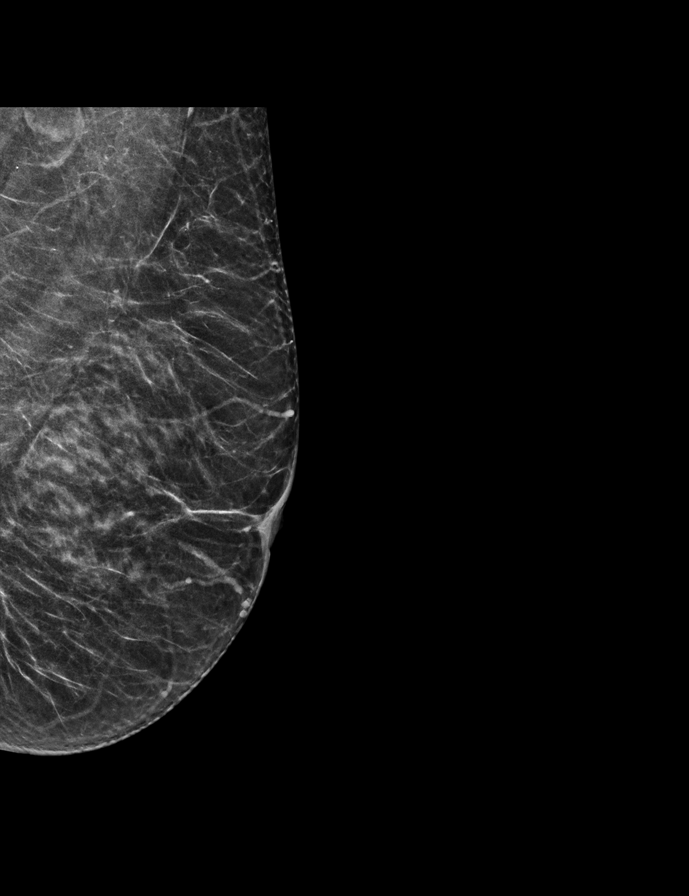

[R CC synth-2D]
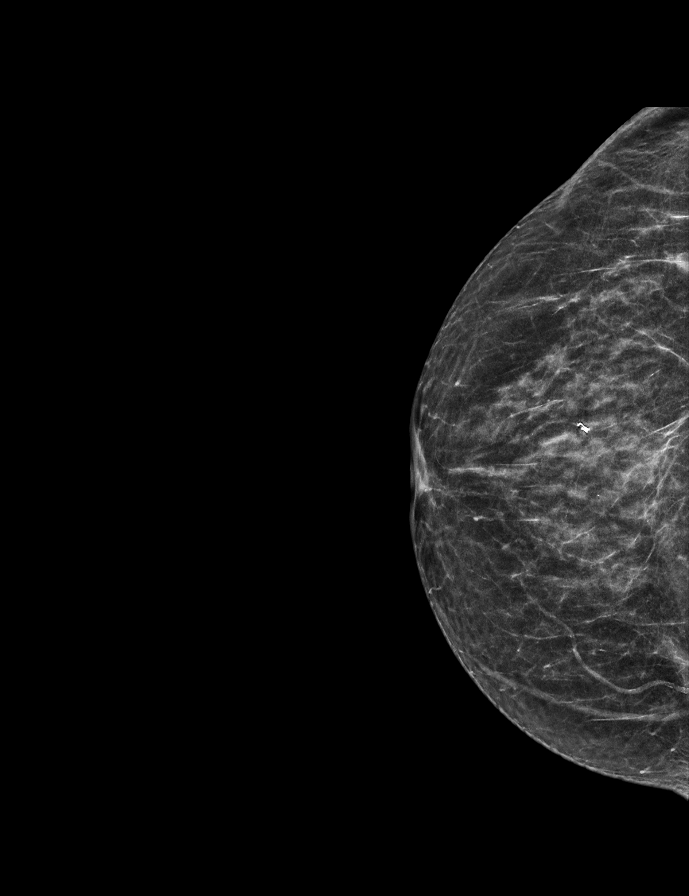

[R MLO synth-2D]
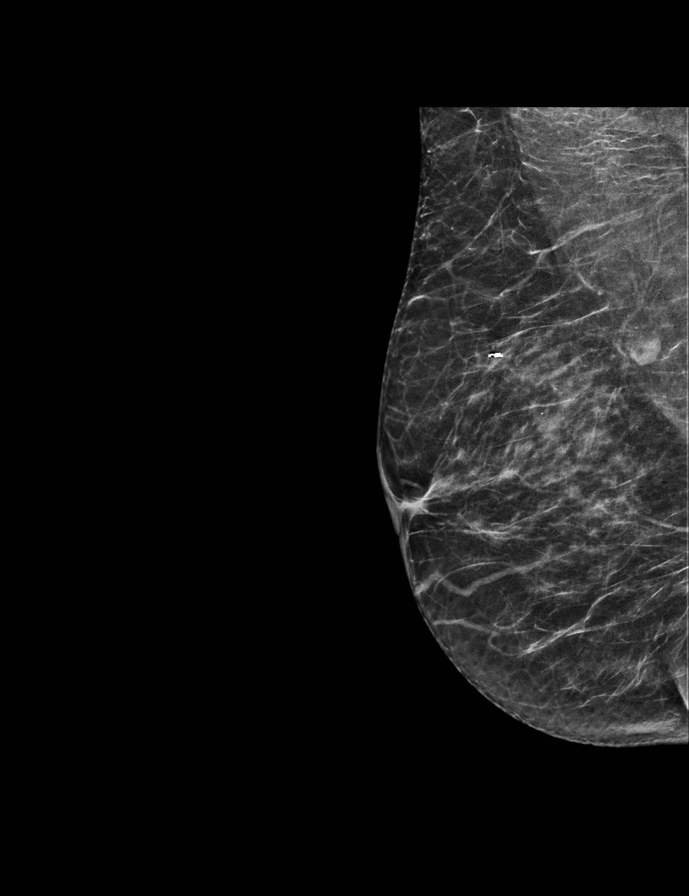

[R CC tomo · 2 of 48 frames shown]
[frame 16/48]
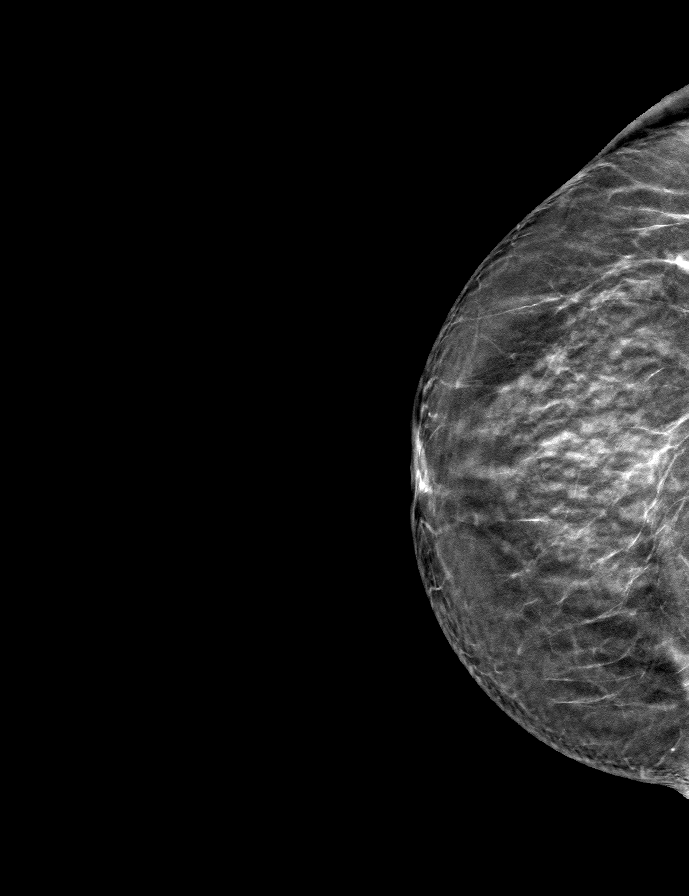
[frame 25/48]
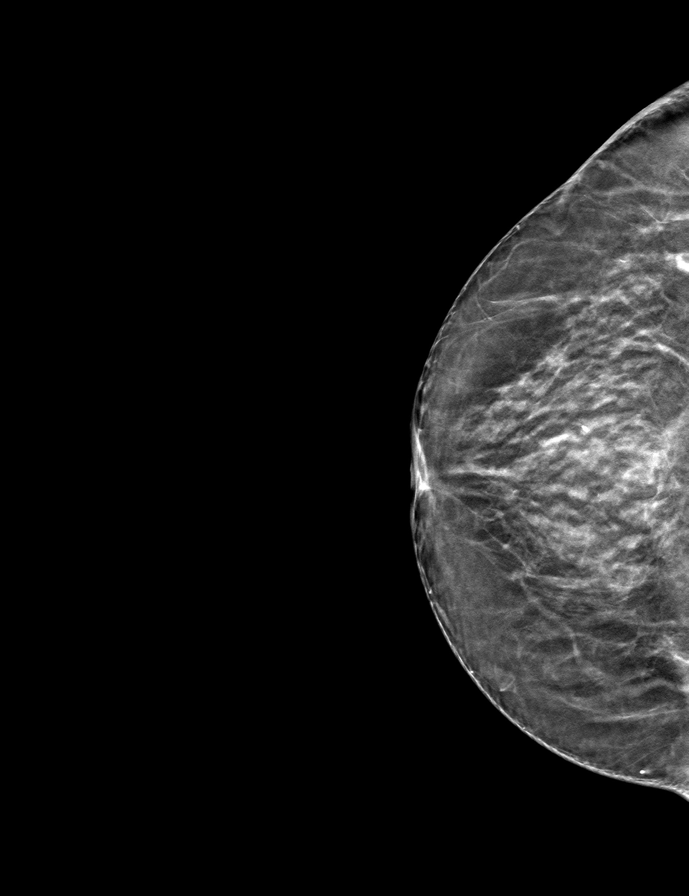

[L CC tomo · tomo slice 26/51.0]
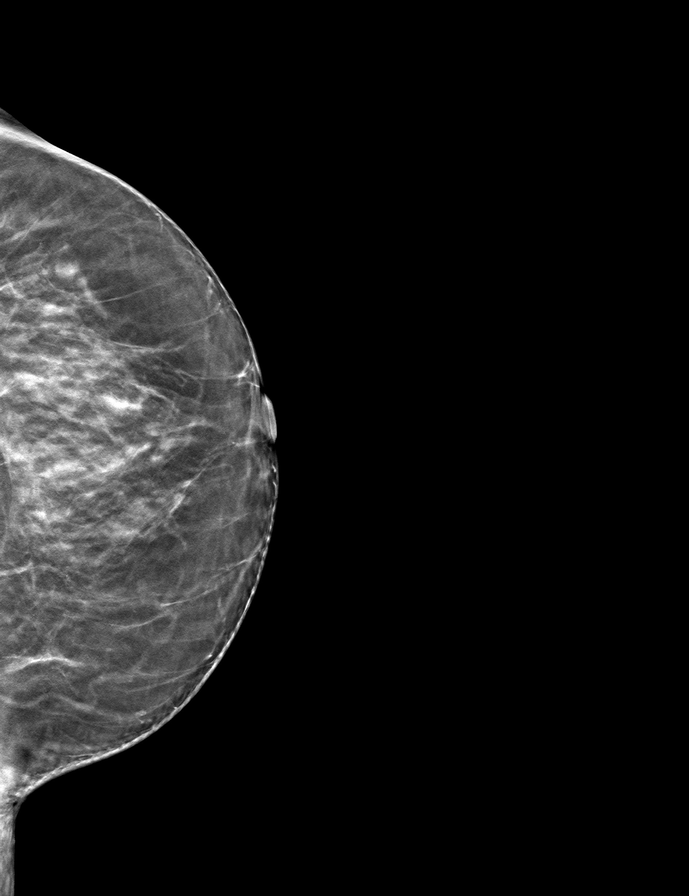

[R MLO tomo · tomo slice 25/50.0]
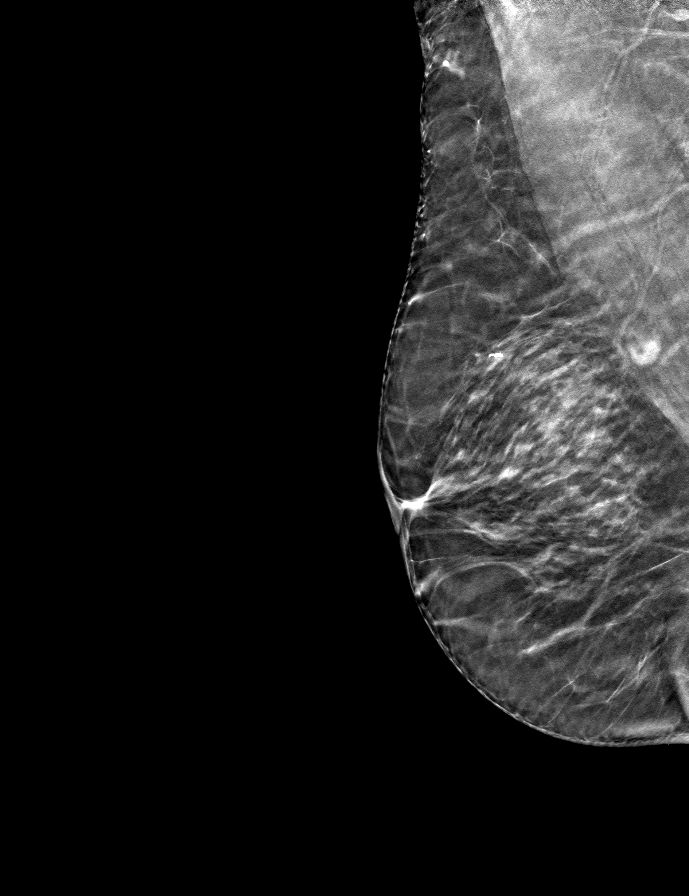

[L MLO tomo · tomo slice 26/51.0]
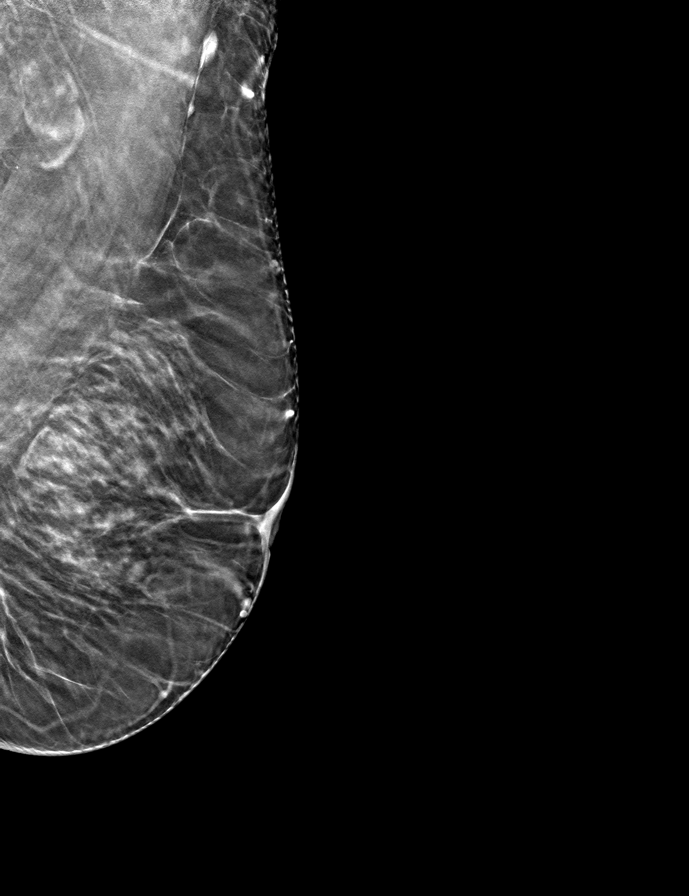

[9 of 24 positions shown; findings below may reference images not displayed]

ACR Breast Density Category c: The breast tissue is heterogeneously
dense, which may obscure small masses.
FINDINGS: There are no findings suspicious for malignancy. Images were
processed with CAD.
IMPRESSION: No mammographic evidence of malignancy. A result letter of this
screening mammogram will be mailed directly to the patient.

RECOMMENDATION:
Screening mammogram in one year. (Code:[id number])

BI-RADS CATEGORY  1: Negative.
